# Patient Record
Sex: Male | Born: 1975 | Hispanic: Yes | Marital: Single | Smoking: Never smoker
Health system: Southern US, Community
[De-identification: ages and names within clinical notes are randomized; demographics above are authoritative.]

---

## 2012-11-23 ENCOUNTER — Ambulatory Visit (INDEPENDENT_AMBULATORY_CARE_PROVIDER_SITE_OTHER): Payer: BC Managed Care – PPO | Admitting: Family Medicine

## 2012-11-23 ENCOUNTER — Telehealth: Payer: Self-pay

## 2012-11-23 VITALS — BP 126/70 | HR 67 | Temp 97.4°F | Resp 18 | Ht 64.0 in | Wt 151.0 lb

## 2012-11-23 DIAGNOSIS — L290 Pruritus ani: Secondary | ICD-10-CM

## 2012-11-23 DIAGNOSIS — M545 Low back pain, unspecified: Secondary | ICD-10-CM

## 2012-11-23 DIAGNOSIS — R1013 Epigastric pain: Secondary | ICD-10-CM

## 2012-11-23 DIAGNOSIS — Z23 Encounter for immunization: Secondary | ICD-10-CM

## 2012-11-23 DIAGNOSIS — H9209 Otalgia, unspecified ear: Secondary | ICD-10-CM

## 2012-11-23 DIAGNOSIS — K625 Hemorrhage of anus and rectum: Secondary | ICD-10-CM

## 2012-11-23 DIAGNOSIS — H9203 Otalgia, bilateral: Secondary | ICD-10-CM

## 2012-11-23 LAB — POCT CBC
Granulocyte percent: 70.1 %G (ref 37–80)
MCH, POC: 28.4 pg (ref 27–31.2)
MCHC: 32.7 g/dL (ref 31.8–35.4)
MCV: 87 fL (ref 80–97)
MID (cbc): 0.3 (ref 0–0.9)
POC LYMPH PERCENT: 25 %L (ref 10–50)
POC MID %: 4.9 %M (ref 0–12)
Platelet Count, POC: 181 10*3/uL (ref 142–424)
RBC: 5.52 M/uL (ref 4.69–6.13)
RDW, POC: 14.1 %
WBC: 5.6 10*3/uL (ref 4.6–10.2)

## 2012-11-23 MED ORDER — OMEPRAZOLE 40 MG PO CPDR: 40.0000 mg | DELAYED_RELEASE_CAPSULE | Freq: Every day | ORAL | Status: AC

## 2012-11-23 NOTE — Progress Notes (Signed)
Subjective: 37 year old Spanish-speaking male who is here with a relative who is interpreting for him. He works as an Arboriculturist for a Public house manager. He has been seeing red rectal bleeding the last couple of days. He says the blood is mixed in the stool, does turn the toilet water red. He has had some anal itching but no pain. No hemorrhoids. He has had some epigastric pain for the past month or so. He does have some otalgia for the last couple of months, and some low back pain for the last couple of months. These are both fairly nonspecific. He has not been taking any over-the-counter analgesic medication. He generally is a healthy young man  Objective: TMs are normal. Throat clear. Neck supple without significant nodes. No CVA tenderness. Mild lower lumbar and sacral pain. Good range of motion. Anus appears unremarkable. Digital exam was negative. Stool was slightly maroonish, and occult blood was tested. No internal hemorrhoids can be palpated. No fissures were visible. On abdominal exam he did have some mild tenderness in the epigastrium.  Assessment: Rectal bleeding Anal itching Epigastric pain Otalgia Low back pain  Plan: Stool for occult blood and CBC  Results for orders placed in visit on 11/23/12  IFOBT (OCCULT BLOOD)      Result Value Range   IFOBT Positive    POCT CBC      Result Value Range   WBC 5.6  4.6 - 10.2 K/uL   Lymph, poc 1.4  0.6 - 3.4   POC LYMPH PERCENT 25.0  10 - 50 %L   MID (cbc) 0.3  0 - 0.9   POC MID % 4.9  0 - 12 %M   POC Granulocyte 3.9  2 - 6.9   Granulocyte percent 70.1  37 - 80 %G   RBC 5.52  4.69 - 6.13 M/uL   Hemoglobin 15.7  14.1 - 18.1 g/dL   HCT, POC 40.9  81.1 - 53.7 %   MCV 87.0  80 - 97 fL   MCH, POC 28.4  27 - 31.2 pg   MCHC 32.7  31.8 - 35.4 g/dL   RDW, POC 91.4     Platelet Count, POC 181  142 - 424 K/uL   MPV    0 - 99.8 fL

## 2012-11-23 NOTE — Telephone Encounter (Signed)
Patient needs an oow note for today.  Seen today.   Call Delia at [phone] when ready for pick up.   She will come and get it.  She is on HIPPA form.

## 2012-11-23 NOTE — Patient Instructions (Addendum)
Take omeprazole one daily for reducing stomach acid  We are making a referral for you to a gastroenterologist. I think you need to have a scope examination.  If you start bleeding badly come back in here or go to the emergency room  Return if further problems  No aspirin, ibuprofen, Aleve, etc.

## 2012-11-23 NOTE — Telephone Encounter (Signed)
Note provided. She is advised.

## 2016-05-19 ENCOUNTER — Ambulatory Visit (INDEPENDENT_AMBULATORY_CARE_PROVIDER_SITE_OTHER): Payer: BLUE CROSS/BLUE SHIELD | Admitting: Physician Assistant

## 2016-05-19 VITALS — BP 142/88 | HR 58 | Temp 97.9°F | Resp 18 | Ht 63.98 in | Wt 154.6 lb

## 2016-05-19 DIAGNOSIS — R109 Unspecified abdominal pain: Secondary | ICD-10-CM

## 2016-05-19 DIAGNOSIS — S29019A Strain of muscle and tendon of unspecified wall of thorax, initial encounter: Secondary | ICD-10-CM

## 2016-05-19 DIAGNOSIS — R102 Pelvic and perineal pain: Secondary | ICD-10-CM

## 2016-05-19 LAB — POCT URINALYSIS DIP (MANUAL ENTRY)
BILIRUBIN UA: NEGATIVE
BILIRUBIN UA: NEGATIVE mg/dL
Blood, UA: NEGATIVE
GLUCOSE UA: NEGATIVE mg/dL
LEUKOCYTES UA: NEGATIVE
NITRITE UA: NEGATIVE
Protein Ur, POC: NEGATIVE mg/dL
SPEC GRAV UA: 1.015 (ref 1.010–1.025)
UROBILINOGEN UA: 0.2 U/dL
pH, UA: 7.5 (ref 5.0–8.0)

## 2016-05-19 MED ORDER — MELOXICAM 15 MG PO TABS: 15.0000 mg | ORAL_TABLET | Freq: Every day | ORAL | 1 refills | Status: AC

## 2016-05-19 MED ORDER — MELOXICAM 15 MG PO TABS: 15.0000 mg | ORAL_TABLET | Freq: Every day | ORAL | 1 refills | Status: DC

## 2016-05-19 NOTE — Progress Notes (Signed)
PRIMARY CARE AT [facility] [address], [place] [address] [phone]  Date:  05/19/2016   Name:  Aaron Hull   DOB:  01-Mar-1975   MRN:  [id number]  PCP:  No PCP Per Patient    History of Present Illness:  Aaron Hull is a 41 y.o. male patient who presents to PCP with  Chief Complaint  Patient presents with  . Abdominal Pain    X 2 weeks  . Back Pain    X 2 mth lower back   Patient has complaint of abdominal pain for 2 weeks and low back pain for 2 months.     Low abdominal pain beneath the naval, more on the left side.  Feels like stick pins.  Pants are tight, aggravates symptoms.  Also when he is sitting down.  Not associated with foods.  No swelling that he has noticed.  No change in bowel movement.  No black or bloody stool.  No nausea.  Pain not associated with heavy living.  No radiating pain into testicles.  No pain or dysuria, or frequency.   He is hydrating well.   No dizzness.    No cancer hx. No diarrhea or constipation.                     History reviewed. No pertinent past medical history.  History reviewed. No pertinent surgical history.  Social History  Substance Use Topics  . Smoking status: Never Smoker  . Smokeless tobacco: Never Used  . Alcohol use No    Family History  Problem Relation Age of Onset  . Heart disease Father     No Known Allergies  Medication list has been reviewed and updated.  Current Outpatient Prescriptions on File Prior to Visit  Medication Sig Dispense Refill  . omeprazole (PRILOSEC) 40 MG capsule Take 1 capsule (40 mg total) by mouth daily. (Patient not taking: Reported on 05/19/2016) 30 capsule 0   No current facility-administered medications on file prior to visit.     ROS ROS otherwise unremarkable unless listed above.  Physical Examination: BP (!) 142/88   Pulse (!) 58   Temp 97.9 F (36.6 C) (Oral)   Resp 18   Ht 5' 3.98" (1.625 m)   Wt 154 lb 9.6 oz (70.1 kg)   SpO2 98%   BMI 26.56 kg/m   Ideal Body Weight: Weight in (lb) to have BMI = 25: 145.2  Physical Exam  Constitutional: He is oriented to person, place, and time. He appears well-developed and well-nourished. No distress.  HENT:  Head: Normocephalic and atraumatic.  Eyes: Conjunctivae and EOM are normal. Pupils are equal, round, and reactive to light.  Cardiovascular: Normal rate and regular rhythm.   Pulmonary/Chest: Effort normal. No respiratory distress.  Abdominal: Soft. Normal appearance and bowel sounds are normal. There is tenderness (left sided groin tenderness upon palpation.  minimal nodular swelling appreciated.). There is no CVA tenderness.  Nothing appreciated with through the inguinal ring  Musculoskeletal:  Lower thoracic tenderness upon palpation bilaterally.  rom normal, with pain incited with lateral deviation of the back  Neurological: He is alert and oriented to person, place, and time.  Skin: Skin is warm and dry. He is not diaphoretic.  Psychiatric: He has a normal mood and affect. His behavior is normal.   Results for orders placed or performed in visit on 05/19/16  POCT urinalysis dipstick  Result Value Ref Range   Color, UA yellow yellow  Clarity, UA clear clear   Glucose, UA negative negative mg/dL   Bilirubin, UA negative negative   Ketones, POC UA negative negative mg/dL   Spec Grav, UA 1.610 9.604 - 1.025   Blood, UA negative negative   pH, UA 7.5 5.0 - 8.0   Protein Ur, POC negative negative mg/dL   Urobilinogen, UA 0.2 0.2 or 1.0 E.U./dL   Nitrite, UA Negative Negative   Leukocytes, UA Negative Negative     Assessment and Plan: Aaron Hull is a 41 y.o. male who is here today for cc of pelvic, and abdominal pain. Possible lymph node enlargement vs inguinal hernia. Ultrasound ordered. Advised Thoracic back pain appears musculoskeletal and separate issue.  Treating with mobic, expressed nsaid precaution, icing, and stretches verbally and written Pelvic pain - Plan: US  Pelvis Complete  Abdominal pain, unspecified abdominal location - Plan: POCT urinalysis dipstick, US Pelvis Complete  Thoracic myofascial strain, initial encounter - Plan: POCT urinalysis dipstick, meloxicam (MOBIC) 15 MG tablet  Trena Platt, PA-C [facility] 4/26/20186:12 AM

## 2016-05-19 NOTE — Patient Instructions (Addendum)
Please take the mobic.  Do not take ibuprofen or naproxen with this medicine.  You can take tylenol Please ice the back three times per day for 15 minutes.  This should be directly after stretches as well. We will contact you with that ultrasound.   Mid-Back Strain With Rehab Mid-Back Strain Rehab Consulte al mdico qu ejercicios son seguros para usted. Haga los ejercicios exactamente como se lo haya indicado el mdico y gradelos como se lo hayan indicado. Es normal sentir un leve estiramiento, tirn, rigidez o molestia cuando haga estos ejercicios, pero debe detenerse de inmediato si siento un dolor repentino o si el dolor empeora. No comience a hacer estos ejercicios hasta que se lo indique el mdico. EJERCICIOS DE [employer] Y AMPLITUD DE MOVIMIENTOS  Este ejercicio calienta los msculos y las articulaciones, y mejora la movilidad y la flexibilidad de la espalda y los hombros. Adems, ayuda a Engineer, materials.  Ejercicio A: Estiramiento de pecho y columna 1. Acustese boca arriba sobre una superficie firme. 2. Use una toalla o una manta pequea para armar un rollo de 4 pulgadas (10 cm) de dimetro. 3. Coloque la toalla a lo largo, debajo de la parte media de la espalda de modo que quede debajo de la columna, pero no debajo de los omplatos. 4. Para aumentar el estiramiento, puede colocar las manos debajo de la cabeza y dejar que los codos caigan a los costados. 5. Mantenga esta posicin durante __________ segundos. Repita el ejercicio __________ veces. Realice este ejercicio __________ veces al da. EJERCICIOS DE FORTALECIMIENTO  Estos ejercicios fortalecen los msculos de la espalda y los omplatos, y les otorgan resistencia. La resistencia es la capacidad de usar los msculos durante un tiempo prolongado, incluso despus de que se cansen. Ejercicio B: Elevaciones alternadas de pierna y brazo 1. Apoye las palmas de las manos y las rodillas sobre una superficie firme. Si se colocar sobre una  superficie muy dura, puede usar un elemento acolchado para apoyar las rodillas, como una alfombrilla para ejercicios. 2. Alinee los brazos y las piernas. Las manos deben estar debajo de los hombros y las rodillas debajo de la cadera. 3. Eleve la pierna izquierda hacia atrs. Al mismo tiempo, eleve el brazo derecho y Associate Professor frente a usted.  No eleve la pierna por encima de la cadera.  No eleve el brazo por encima del hombro.  Mantenga los msculos del abdomen y de la espalda contrados.  Mantenga la cadera mirando hacia el suelo.  No arquee la espalda.  Mantenga el equilibrio con cuidado y no contenga la respiracin. 4. Mantenga esta posicin durante __________ segundos. 5. Lentamente regrese a la posicin inicial y repita el ejercicio con la pierna derecha y el brazo izquierdo. Repita __________ veces. Realice este ejercicio __________ veces al da. Ejercicio C: Remar con los brazos extendidos (extensin de hombros) 1. Prese con los pies separados a la distancia de los hombros. 2. Ate una banda para ejercicios a un objeto estable que est frente a usted, de modo que la banda est por encima de la altura del hombro o en el mismo nivel. 3. Sostenga un extremo de la banda para ejercicios en cada mano. 4. Extienda los codos y [employer] manos a la altura de los hombros. 5. Camine hacia atrs, alejndose del extremo fijo de la banda para ejercicios, hasta que [place] se tense. 6. Junte los omplatos y baje las manos hacia los costados de los muslos. Detngase cuando las manos estn en la misma posicin  en ambos costados. No deje que las manos vayan hacia atrs del cuerpo. 7. Mantenga esta posicin durante __________ segundos. 8. Vuelva lentamente a la posicin inicial. Repita __________ veces. Realice este ejercicio __________ veces al da. Ejercicio D: Rotacin externa con el hombro, en decbito prono 1. Acustese boca abajo en una cama firme de modo que el antebrazo izquierdo/derecho  cuelgue sobre el borde de la cama y la parte superior del brazo quede sobre la cama, extendido lejos del cuerpo.  El codo debe estar flexionado.  La palma debe [employer]. 2. Si se lo indican, sostenga una pesa de __________ con la mano. 3. Contraiga los omplatos hacia el centro de la espalda. No levante el hombro hacia la [place]. 4. Mantenga el codo flexionado en forma de "L" (90 grados) mientras mueve el antebrazo hacia el techo lentamente. Mueva el antebrazo hasta la altura de la cama en direccin a la cabeza.  No mueva la parte superior del brazo.  En el final del movimiento, la palma debe apuntar hacia el suelo. 5. Mantenga esta posicin durante __________ segundos. 6. Vuelva lentamente a la posicin inicial y relaje los msculos. Repita __________ veces. Realice este ejercicio __________ veces al da. Ejercicio E: Retraccin escapular y rotacin externa, remo 1. Sintese en una silla estable que no tenga apoyabrazos o pngase de pie. 2. Ate una banda para ejercicios a un objeto estable que est frente a usted, de modo que la banda est a la altura del hombro. 3. Sostenga un extremo de la banda para ejercicios en cada mano. 4. Lleve los brazos extendidos adelante. 5. Camine hacia atrs, alejndose del extremo fijo de la banda para ejercicios, [employer] se tense. 6. Tire la [employer]. Mientras hace esto, flexione los codos y [employer], pero evite mover el resto del cuerpo. No levante los hombros [employer]. 7. Detngase cuando los codos estn a los lados o ligeramente detrs del cuerpo. 8. Mantenga esta posicin durante __________ segundos. 9. Extienda lentamente los brazos para regresar a la posicin inicial. Repita __________ veces. Realice este ejercicio __________ veces al da. Tyson Dense Y [employer]ECNICA CORPORAL  La Water quality scientist se refiere a los movimientos y a las posiciones del cuerpo mientras realiza las actividades diarias. La postura es  una parte de la Water quality scientist. La buena postura y la Administrator, sports corporal saludable pueden ayudar a Acupuncturist estrs en las articulaciones y los tejidos del cuerpo. La buena postura significa que la columna mantiene su posicin natural de curvatura en forma de S (la columna est en una posicin neutral), los hombros Zenaida Niece un poco hacia atrs y la cabeza no se inclina hacia adelante. A continuacin, se incluyen pautas generales para mejorar la postura y Regulatory affairs officer en las actividades diarias. De pie  Al estar de pie, mantenga la columna en la posicin neutral y los pies separados al ancho de caderas, aproximadamente. Mantenga las rodillas ligeramente flexionadas. [place], los hombros y las caderas deben estar alineados.  Cuando realice una tarea en la que deba inclinarse hacia adelante y estar de pie en el mismo sitio durante mucho tiempo, coloque un pie en un objeto estable de 2 a 4 pulgadas (5 a 10 cm) de alto, como un taburete. Esto ayuda a que la columna mantenga una posicin neutral. Sentado  Cuando est sentado, mantenga la columna en posicin neutral y deje los pies apoyados en el suelo. Use un apoyapis, si es necesario, y [employer] muslos paralelos  al suelo. Evite redondear los hombros e inclinar la cabeza hacia adelante.  Cuando trabaje en un escritorio o con una computadora, el escritorio debe estar a una altura en la que las manos estn un poco ms abajo que los codos. Deslice la silla debajo del escritorio, de modo de estar lo suficientemente cerca como para mantener una buena [place].  Cuando trabaje con una computadora, coloque el monitor a una altura que le permita mirar derecho hacia adelante, sin tener que inclinar la cabeza hacia adelante o [place] atrs. Reposo  Al descansar o estar acostado, evite las posiciones que le causen ms dolor.  Si siente dolor al hacer actividades que exigen sentarse, inclinarse, agacharse o ponerse en cuclillas (actividades basadas en la  flexin), acustese en una posicin en la que el cuerpo no deba doblarse mucho. Por ejemplo, evite acurrucarse de costado con los brazos y las rodillas cerca del pecho (posicin fetal).  Si siente dolor con las actividades que exigen estar de pie durante mucho tiempo o Furniture conservator/restorer los brazos (actividades basadas en la extensin), acustese con la columna en una posicin neutral y flexione ligeramente las rodillas. Pruebe con las siguientes posiciones:  Acostarse de costado con una almohada entre las rodillas.  Acostarse boca arriba con una almohada debajo de las rodillas. Levantar objetos  Cuando tenga que levantar un objeto, mantenga los pies separados el ancho de los hombros y apriete los msculos abdominales.  Flexione las rodillas y la cadera, y Dietitian la columna en posicin neutral. Es importante levantar utilizando la fuerza de las piernas, no de la espalda. No trabe las rodillas hacia afuera.  Siempre pida ayuda a otra persona para levantar objetos pesados o incmodos. Esta informacin no tiene Theme park manager el consejo del mdico. Asegrese de hacerle al mdico cualquier pregunta que tenga. Document Released: 10/29/2005 Document Revised: 05/29/2014 Document Reviewed: 10/24/2014 Elsevier Interactive Patient Education  2017 [employer].     IF you received an x-ray today, you will receive an invoice from [facility]. Please contact [facility] at [phone] with questions or concerns regarding your invoice.   IF you received labwork today, you will receive an invoice from [place]. Please contact [facility] at [phone] with questions or concerns regarding your invoice.   Our billing staff will not be able to assist you with questions regarding bills from these companies.  You will be contacted with the lab results as soon as they are available. The fastest way to get your results is to activate your My Chart account. Instructions are located on the last  page of this paperwork. If you have not heard from [place] regarding the results in 2 weeks, please contact this office.

## 2016-05-27 ENCOUNTER — Telehealth: Payer: Self-pay | Admitting: Physician Assistant

## 2016-05-27 DIAGNOSIS — R1909 Other intra-abdominal and pelvic swelling, mass and lump: Secondary | ICD-10-CM

## 2016-05-27 NOTE — Telephone Encounter (Signed)
Please change u/s order to pelvis limited (img550) and diagnosis to inguinal pain if appropriate. Thanks!

## 2016-05-28 NOTE — Telephone Encounter (Signed)
Thanks so much, I have made the change.

## 2016-06-02 ENCOUNTER — Ambulatory Visit
Admission: RE | Admit: 2016-06-02 | Discharge: 2016-06-02 | Disposition: A | Discharge status: Home / Self Care | Payer: BLUE CROSS/BLUE SHIELD | Source: Ambulatory Visit | Attending: Physician Assistant | Admitting: Physician Assistant

## 2016-06-02 DIAGNOSIS — R1909 Other intra-abdominal and pelvic swelling, mass and lump: Secondary | ICD-10-CM | POA: Diagnosis not present

## 2016-06-04 ENCOUNTER — Telehealth: Payer: Self-pay | Admitting: Physician Assistant

## 2016-06-04 NOTE — Telephone Encounter (Signed)
Pts friend is calling on behalf of pt.  They are trying to get the results of his x-ray that he had on 05/19/16.  Delia is on the HIPPA list of people we are allowed to talk to.  Please call and give her the results.  Pt does not speak english. [phone]

## 2016-06-05 NOTE — Telephone Encounter (Signed)
Delia advised , will monitor pain, improving

## 2016-06-05 NOTE — Telephone Encounter (Signed)
Please advise 

## 2016-06-05 NOTE — Telephone Encounter (Signed)
They are referring to the ultrasound that was performed 3 days ago, and this was not telling to the left sided pain.  I suggest that if he is still having the pain that we obtain a CT of this area.  If the symptoms are resolved we can watchfully wait, or proceed with obtaining a pelvic cT.

## 2017-04-26 ENCOUNTER — Encounter: Payer: Self-pay | Admitting: Physician Assistant

## 2017-05-18 ENCOUNTER — Encounter: Payer: BLUE CROSS/BLUE SHIELD | Admitting: Urgent Care

## 2017-05-18 DIAGNOSIS — R030 Elevated blood-pressure reading, without diagnosis of hypertension: Secondary | ICD-10-CM | POA: Diagnosis not present

## 2017-05-18 DIAGNOSIS — M545 Low back pain: Secondary | ICD-10-CM | POA: Diagnosis not present

## 2017-06-07 IMAGING — US US PELVIS LIMITED
1 series · 14 of 23 positions shown · non-contrast
Comparison: None.

CLINICAL DATA: Left groin-inguinal mass ; painful sharp PA and
light sensation in the groin. Duration of symptoms 1 month

EXAM:
LIMITED ULTRASOUND OF PELVIS
TECHNIQUE: Limited transabdominal ultrasound examination of the pelvis was
performed.

[Series 1: us pelvis limited · 0.11mm/px · 23 acquisitions, 14 frames shown]
[im 1/23]
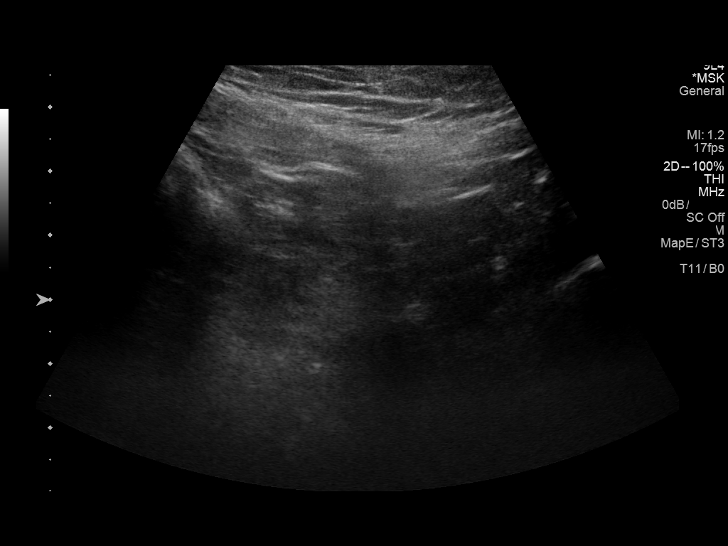
[im 3/23]
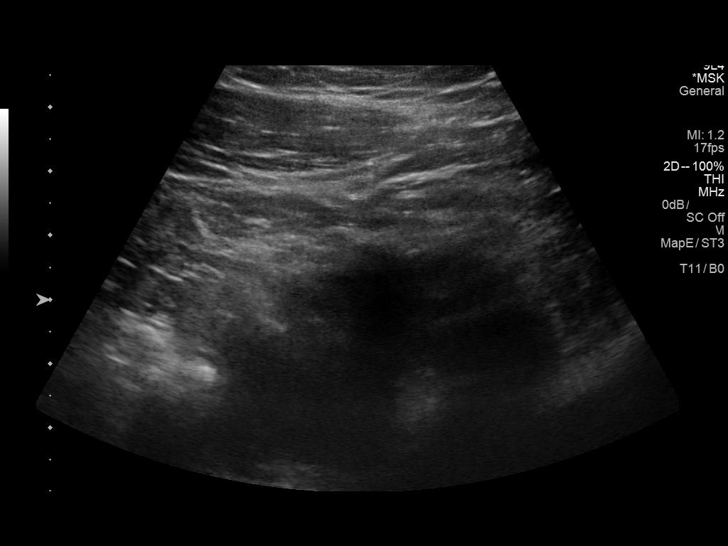
[im 5/23]
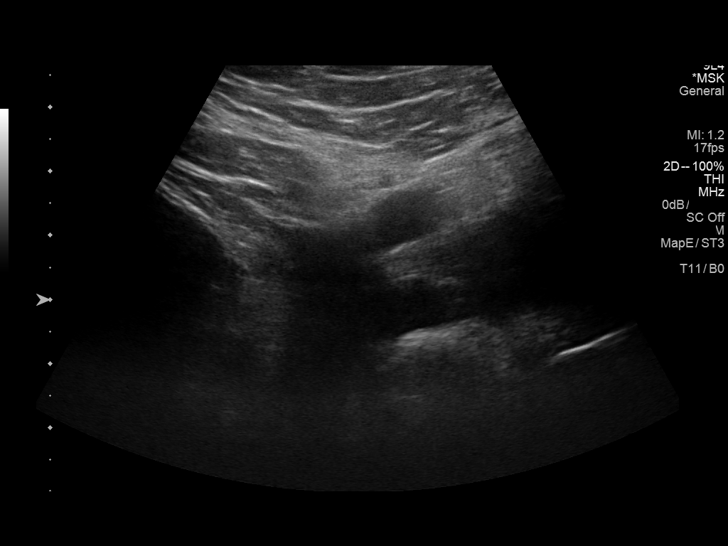
[im 6/23]
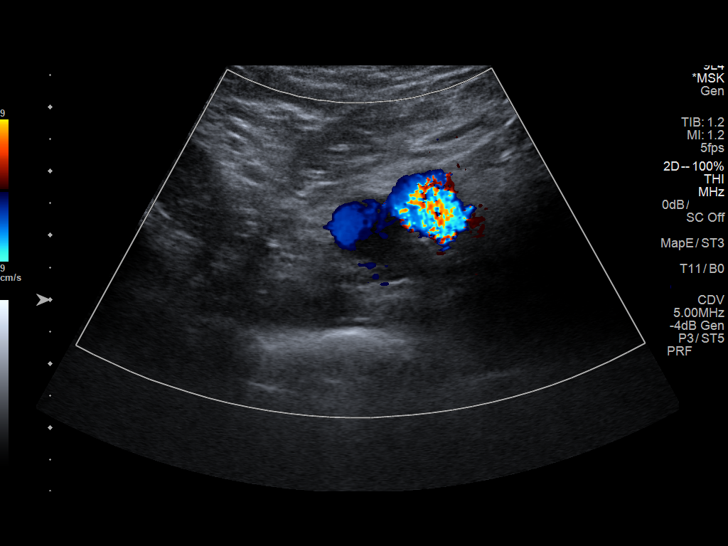
[im 8/23]
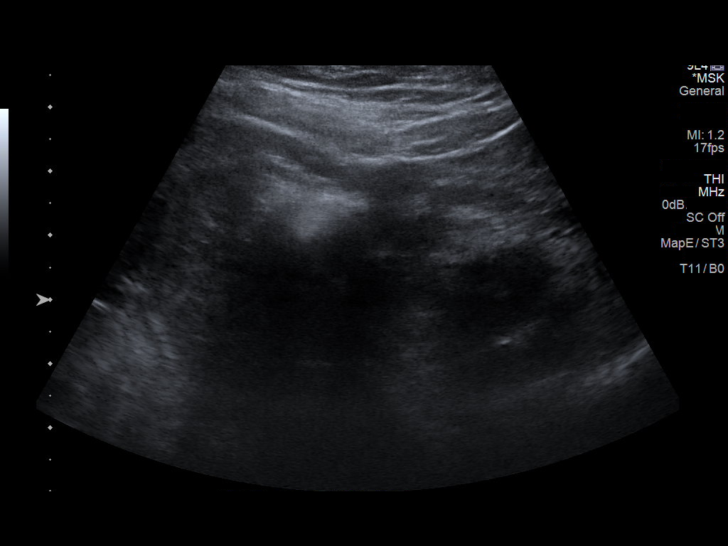
[im 10/23]
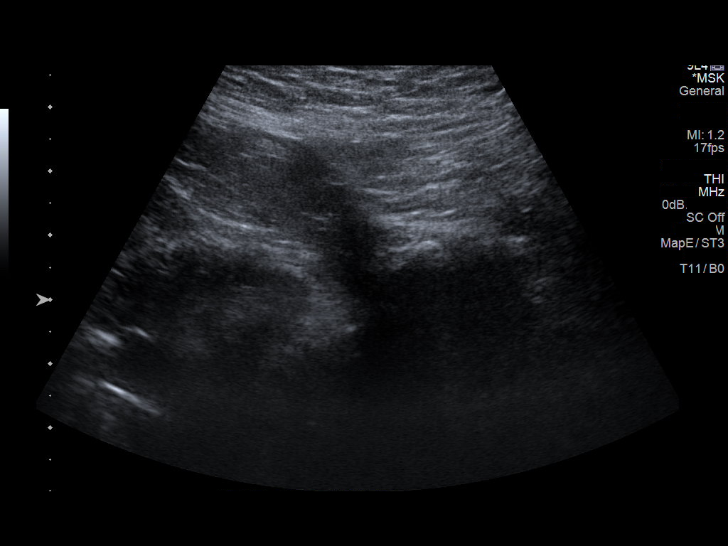
[im 11/23]
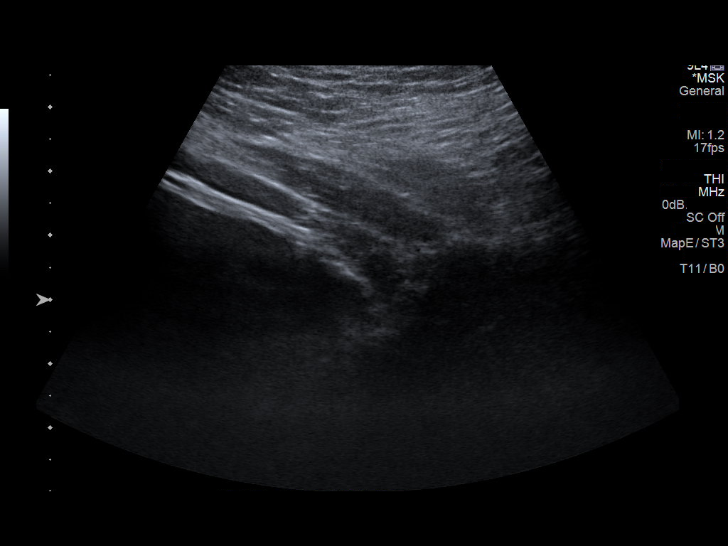
[im 13/23]
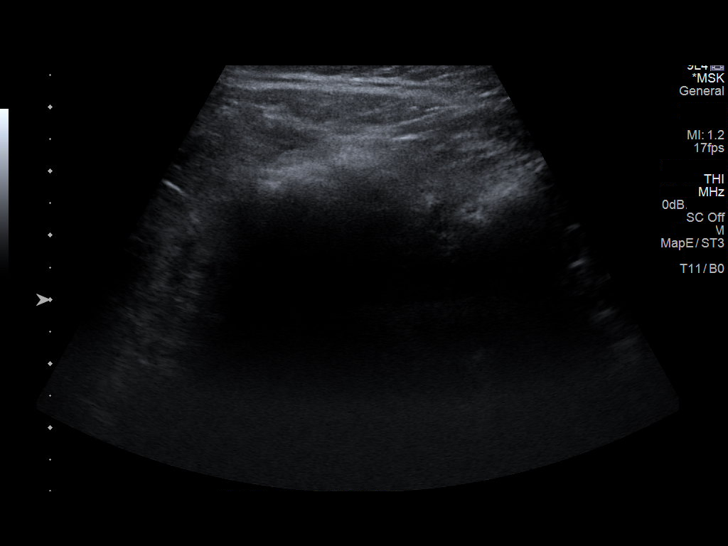
[im 14/23]
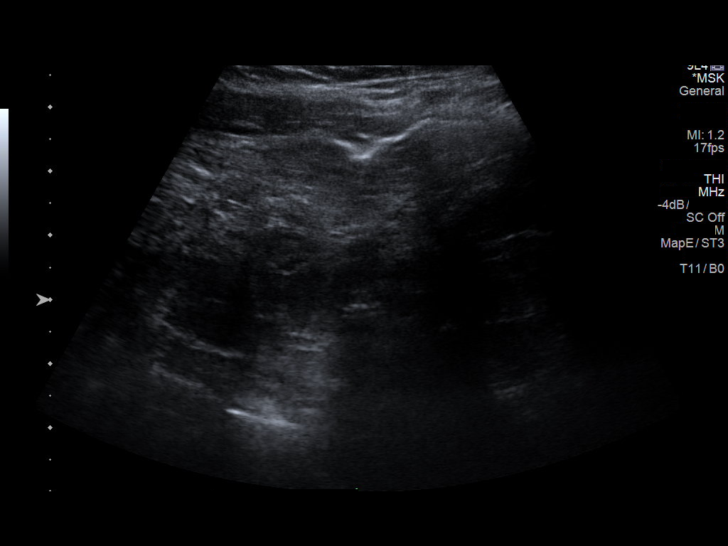
[im 16/23]
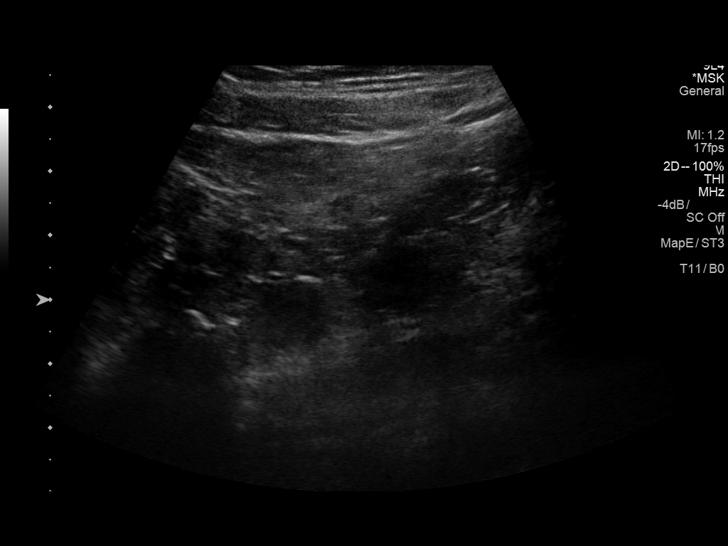
[im 18/23]
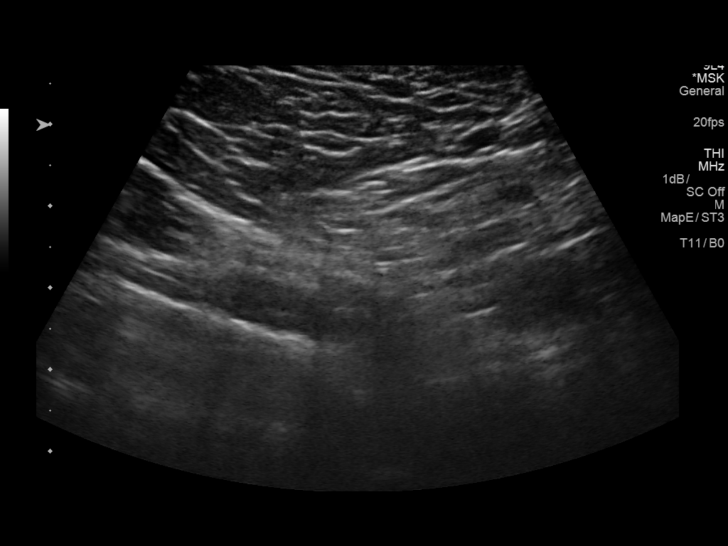
[im 19/23]
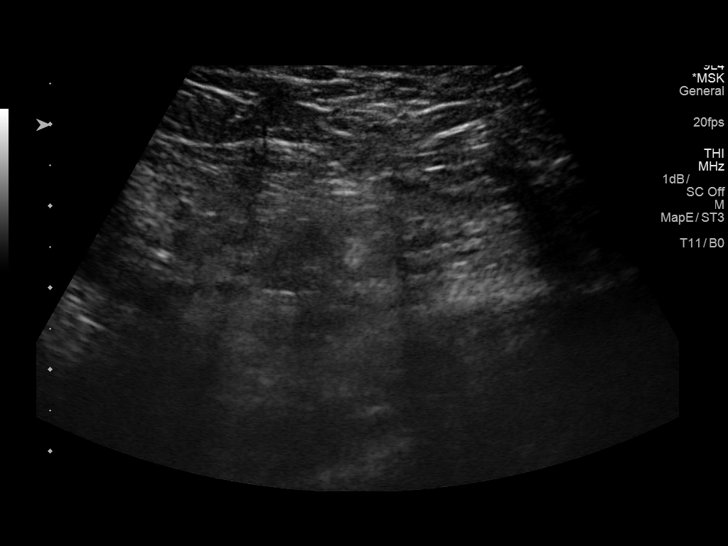
[im 21/23]
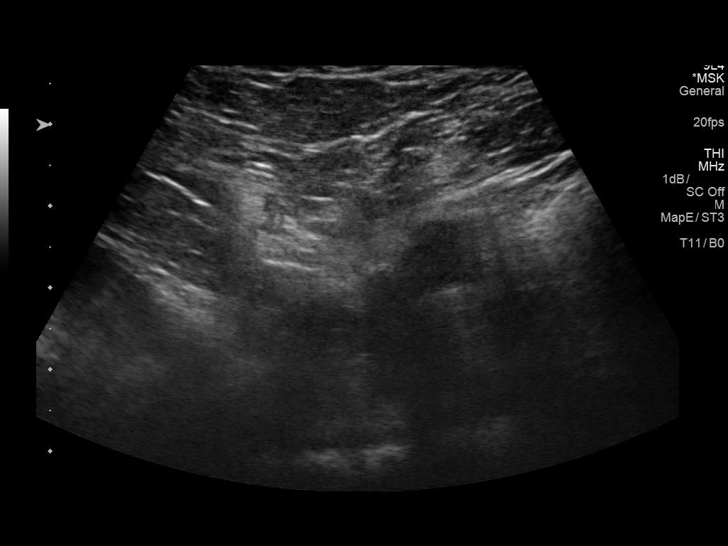
[im 23/23]
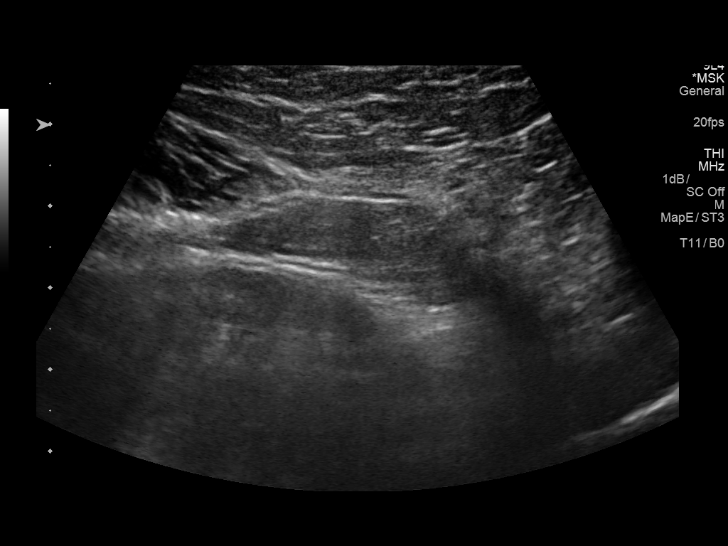

[14 of 23 positions shown; findings below may reference images not displayed]

FINDINGS: In the area of clinical concern no palpable mass is present. No
ultrasonic cystic or solid appearing mass is demonstrated. Note
changes occurred during the Valsalva maneuver.
IMPRESSION: No left inguinal mass or hernia is demonstrated.

## 2017-07-04 DIAGNOSIS — I10 Essential (primary) hypertension: Secondary | ICD-10-CM | POA: Diagnosis not present

## 2017-07-22 DIAGNOSIS — I10 Essential (primary) hypertension: Secondary | ICD-10-CM | POA: Diagnosis not present

## 2017-07-22 DIAGNOSIS — Z1389 Encounter for screening for other disorder: Secondary | ICD-10-CM | POA: Diagnosis not present

## 2017-07-22 DIAGNOSIS — Z23 Encounter for immunization: Secondary | ICD-10-CM | POA: Diagnosis not present

## 2017-07-22 DIAGNOSIS — Z0000 Encounter for general adult medical examination without abnormal findings: Secondary | ICD-10-CM | POA: Diagnosis not present

## 2018-01-10 DIAGNOSIS — J309 Allergic rhinitis, unspecified: Secondary | ICD-10-CM | POA: Diagnosis not present

## 2018-01-10 DIAGNOSIS — Z23 Encounter for immunization: Secondary | ICD-10-CM | POA: Diagnosis not present

## 2018-01-10 DIAGNOSIS — K0510 Chronic gingivitis, plaque induced: Secondary | ICD-10-CM | POA: Diagnosis not present

## 2018-01-10 DIAGNOSIS — I10 Essential (primary) hypertension: Secondary | ICD-10-CM | POA: Diagnosis not present

## 2018-08-08 DIAGNOSIS — Z0000 Encounter for general adult medical examination without abnormal findings: Secondary | ICD-10-CM | POA: Diagnosis not present

## 2018-08-08 DIAGNOSIS — Z1389 Encounter for screening for other disorder: Secondary | ICD-10-CM | POA: Diagnosis not present

## 2018-08-08 DIAGNOSIS — I10 Essential (primary) hypertension: Secondary | ICD-10-CM | POA: Diagnosis not present

## 2019-01-25 DIAGNOSIS — H524 Presbyopia: Secondary | ICD-10-CM | POA: Diagnosis not present

## 2019-02-08 DIAGNOSIS — I10 Essential (primary) hypertension: Secondary | ICD-10-CM | POA: Diagnosis not present

## 2019-02-08 DIAGNOSIS — E785 Hyperlipidemia, unspecified: Secondary | ICD-10-CM | POA: Diagnosis not present

## 2019-02-08 DIAGNOSIS — Z23 Encounter for immunization: Secondary | ICD-10-CM | POA: Diagnosis not present

## 2019-02-08 DIAGNOSIS — K219 Gastro-esophageal reflux disease without esophagitis: Secondary | ICD-10-CM | POA: Diagnosis not present

## 2019-07-18 DIAGNOSIS — K219 Gastro-esophageal reflux disease without esophagitis: Secondary | ICD-10-CM | POA: Diagnosis not present

## 2019-07-18 DIAGNOSIS — J309 Allergic rhinitis, unspecified: Secondary | ICD-10-CM | POA: Diagnosis not present

## 2019-07-18 DIAGNOSIS — Z125 Encounter for screening for malignant neoplasm of prostate: Secondary | ICD-10-CM | POA: Diagnosis not present

## 2019-07-18 DIAGNOSIS — Z13220 Encounter for screening for lipoid disorders: Secondary | ICD-10-CM | POA: Diagnosis not present

## 2019-07-18 DIAGNOSIS — Z0000 Encounter for general adult medical examination without abnormal findings: Secondary | ICD-10-CM | POA: Diagnosis not present

## 2019-07-18 DIAGNOSIS — I10 Essential (primary) hypertension: Secondary | ICD-10-CM | POA: Diagnosis not present

## 2020-01-22 DIAGNOSIS — I10 Essential (primary) hypertension: Secondary | ICD-10-CM | POA: Diagnosis not present

## 2020-01-22 DIAGNOSIS — J309 Allergic rhinitis, unspecified: Secondary | ICD-10-CM | POA: Diagnosis not present

## 2020-01-22 DIAGNOSIS — B351 Tinea unguium: Secondary | ICD-10-CM | POA: Diagnosis not present

## 2020-07-23 DIAGNOSIS — Z13220 Encounter for screening for lipoid disorders: Secondary | ICD-10-CM | POA: Diagnosis not present

## 2020-07-23 DIAGNOSIS — I10 Essential (primary) hypertension: Secondary | ICD-10-CM | POA: Diagnosis not present

## 2020-07-23 DIAGNOSIS — Z125 Encounter for screening for malignant neoplasm of prostate: Secondary | ICD-10-CM | POA: Diagnosis not present

## 2020-07-23 DIAGNOSIS — Z0000 Encounter for general adult medical examination without abnormal findings: Secondary | ICD-10-CM | POA: Diagnosis not present

## 2021-01-07 DIAGNOSIS — I10 Essential (primary) hypertension: Secondary | ICD-10-CM | POA: Diagnosis not present

## 2021-01-07 DIAGNOSIS — F411 Generalized anxiety disorder: Secondary | ICD-10-CM | POA: Diagnosis not present

## 2021-01-07 DIAGNOSIS — G479 Sleep disorder, unspecified: Secondary | ICD-10-CM | POA: Diagnosis not present

## 2021-01-07 DIAGNOSIS — Z23 Encounter for immunization: Secondary | ICD-10-CM | POA: Diagnosis not present

## 2021-01-07 DIAGNOSIS — K0889 Other specified disorders of teeth and supporting structures: Secondary | ICD-10-CM | POA: Diagnosis not present

## 2021-03-18 DIAGNOSIS — I10 Essential (primary) hypertension: Secondary | ICD-10-CM | POA: Diagnosis not present

## 2021-05-13 DIAGNOSIS — I10 Essential (primary) hypertension: Secondary | ICD-10-CM | POA: Diagnosis not present

## 2021-05-13 DIAGNOSIS — B351 Tinea unguium: Secondary | ICD-10-CM | POA: Diagnosis not present

## 2021-08-14 DIAGNOSIS — Z13220 Encounter for screening for lipoid disorders: Secondary | ICD-10-CM | POA: Diagnosis not present

## 2021-08-14 DIAGNOSIS — Z125 Encounter for screening for malignant neoplasm of prostate: Secondary | ICD-10-CM | POA: Diagnosis not present

## 2021-08-14 DIAGNOSIS — I10 Essential (primary) hypertension: Secondary | ICD-10-CM | POA: Diagnosis not present

## 2021-08-14 DIAGNOSIS — K5900 Constipation, unspecified: Secondary | ICD-10-CM | POA: Diagnosis not present

## 2021-08-14 DIAGNOSIS — Z0000 Encounter for general adult medical examination without abnormal findings: Secondary | ICD-10-CM | POA: Diagnosis not present

## 2022-02-11 DIAGNOSIS — I10 Essential (primary) hypertension: Secondary | ICD-10-CM | POA: Diagnosis not present

## 2022-02-11 DIAGNOSIS — Z23 Encounter for immunization: Secondary | ICD-10-CM | POA: Diagnosis not present

## 2022-02-11 DIAGNOSIS — J309 Allergic rhinitis, unspecified: Secondary | ICD-10-CM | POA: Diagnosis not present

## 2022-08-17 DIAGNOSIS — Z0000 Encounter for general adult medical examination without abnormal findings: Secondary | ICD-10-CM | POA: Diagnosis not present

## 2022-08-17 DIAGNOSIS — Z125 Encounter for screening for malignant neoplasm of prostate: Secondary | ICD-10-CM | POA: Diagnosis not present

## 2022-08-17 DIAGNOSIS — I10 Essential (primary) hypertension: Secondary | ICD-10-CM | POA: Diagnosis not present

## 2022-08-17 DIAGNOSIS — E785 Hyperlipidemia, unspecified: Secondary | ICD-10-CM | POA: Diagnosis not present

## 2023-02-22 DIAGNOSIS — J309 Allergic rhinitis, unspecified: Secondary | ICD-10-CM | POA: Diagnosis not present

## 2023-02-22 DIAGNOSIS — I10 Essential (primary) hypertension: Secondary | ICD-10-CM | POA: Diagnosis not present

## 2023-08-24 DIAGNOSIS — E785 Hyperlipidemia, unspecified: Secondary | ICD-10-CM | POA: Diagnosis not present

## 2023-08-24 DIAGNOSIS — Z0000 Encounter for general adult medical examination without abnormal findings: Secondary | ICD-10-CM | POA: Diagnosis not present

## 2023-08-24 DIAGNOSIS — J309 Allergic rhinitis, unspecified: Secondary | ICD-10-CM | POA: Diagnosis not present

## 2023-08-24 DIAGNOSIS — I10 Essential (primary) hypertension: Secondary | ICD-10-CM | POA: Diagnosis not present
